# Patient Record
Sex: Female | Born: 1995 | Hispanic: Yes | Marital: Single | Smoking: Never smoker
Health system: Southern US, Community
[De-identification: ages and names within clinical notes are randomized; demographics above are authoritative.]

## PROBLEM LIST (undated history)

## (undated) HISTORY — PX: APPENDECTOMY: SHX54

---

## 2015-06-21 ENCOUNTER — Ambulatory Visit: Payer: Self-pay | Admitting: Physician Assistant

## 2015-06-21 ENCOUNTER — Encounter: Payer: Self-pay | Admitting: Physician Assistant

## 2015-06-21 VITALS — BP 100/70 | HR 100 | Temp 98.7°F

## 2015-06-21 DIAGNOSIS — J020 Streptococcal pharyngitis: Secondary | ICD-10-CM

## 2015-06-21 LAB — POCT RAPID STREP A (OFFICE): Rapid Strep A Screen: POSITIVE — AB

## 2015-06-21 MED ORDER — AZITHROMYCIN 250 MG PO TABS: ORAL_TABLET | ORAL | Status: DC

## 2015-06-21 NOTE — Progress Notes (Signed)
S: c/o sore throat for 1 day, no fever/chills, no cough or congestion, just hurts to swallow, no known exposure to strep  O: vitals wnl, nad, tms clear, throat red with swollen tonsils, neck supple no lymph lungs c t a, cv rrr, q strep with? Very faint +,   A: acute pharyngitis  P: zpack

## 2016-08-12 DIAGNOSIS — N921 Excessive and frequent menstruation with irregular cycle: Secondary | ICD-10-CM | POA: Diagnosis not present

## 2016-08-12 DIAGNOSIS — Z7689 Persons encountering health services in other specified circumstances: Secondary | ICD-10-CM | POA: Diagnosis not present

## 2017-01-27 ENCOUNTER — Ambulatory Visit: Payer: Self-pay | Admitting: Physician Assistant

## 2017-01-27 ENCOUNTER — Encounter: Payer: Self-pay | Admitting: Physician Assistant

## 2017-01-27 VITALS — BP 100/70 | HR 101 | Temp 98.8°F

## 2017-01-27 DIAGNOSIS — N390 Urinary tract infection, site not specified: Secondary | ICD-10-CM

## 2017-01-27 DIAGNOSIS — R319 Hematuria, unspecified: Secondary | ICD-10-CM

## 2017-01-27 DIAGNOSIS — R300 Dysuria: Secondary | ICD-10-CM

## 2017-01-27 LAB — POCT URINALYSIS DIPSTICK
Bilirubin, UA: NEGATIVE
Glucose, UA: NEGATIVE
Ketones, UA: NEGATIVE
Nitrite, UA: NEGATIVE
Spec Grav, UA: 1.025 (ref 1.010–1.025)
Urobilinogen, UA: 0.2 E.U./dL
pH, UA: 6.5 (ref 5.0–8.0)

## 2017-01-27 MED ORDER — NITROFURANTOIN MONOHYD MACRO 100 MG PO CAPS: 100.0000 mg | ORAL_CAPSULE | Freq: Two times a day (BID) | ORAL | 0 refills | Status: AC

## 2017-01-27 NOTE — Progress Notes (Signed)
S:  C/o uti sx for 2 days, burning, urgency, frequency, denies fever/chills, vaginal discharge, abdominal pain or flank pain:  Remainder ros neg  O:  Vitals wnl, nad, no cva tenderness, back nontender, lungs c t a,cv rrr, abd soft nontender, bs normal, n/v intact, ua 2+leuks, 3+ blood  A: uti  P: macrobid  bid x 7d, increase water intake, add cranberry juice, return if not improving in 2 -3 days, return earlier if worsening, discussed pyelonephritis sx

## 2017-05-11 DIAGNOSIS — N912 Amenorrhea, unspecified: Secondary | ICD-10-CM | POA: Diagnosis not present

## 2017-05-26 DIAGNOSIS — O3680X0 Pregnancy with inconclusive fetal viability, not applicable or unspecified: Secondary | ICD-10-CM | POA: Diagnosis not present

## 2017-06-03 DIAGNOSIS — Z1389 Encounter for screening for other disorder: Secondary | ICD-10-CM | POA: Diagnosis not present

## 2017-06-03 DIAGNOSIS — Z3200 Encounter for pregnancy test, result unknown: Secondary | ICD-10-CM | POA: Diagnosis not present

## 2017-06-03 DIAGNOSIS — Z3201 Encounter for pregnancy test, result positive: Secondary | ICD-10-CM | POA: Diagnosis not present

## 2017-06-22 DIAGNOSIS — Z1389 Encounter for screening for other disorder: Secondary | ICD-10-CM | POA: Diagnosis not present

## 2017-06-22 DIAGNOSIS — Z3401 Encounter for supervision of normal first pregnancy, first trimester: Secondary | ICD-10-CM | POA: Diagnosis not present

## 2017-07-21 DIAGNOSIS — Z3402 Encounter for supervision of normal first pregnancy, second trimester: Secondary | ICD-10-CM | POA: Diagnosis not present

## 2017-07-23 ENCOUNTER — Other Ambulatory Visit: Payer: Self-pay | Admitting: Family Medicine

## 2017-07-23 DIAGNOSIS — Z3402 Encounter for supervision of normal first pregnancy, second trimester: Secondary | ICD-10-CM

## 2017-08-17 ENCOUNTER — Ambulatory Visit
Admission: RE | Admit: 2017-08-17 | Discharge: 2017-08-17 | Disposition: A | Discharge status: Home / Self Care | Payer: 59 | Source: Ambulatory Visit | Attending: Family Medicine | Admitting: Family Medicine

## 2017-08-17 DIAGNOSIS — Z369 Encounter for antenatal screening, unspecified: Secondary | ICD-10-CM | POA: Diagnosis not present

## 2017-08-17 DIAGNOSIS — Z3402 Encounter for supervision of normal first pregnancy, second trimester: Secondary | ICD-10-CM | POA: Insufficient documentation

## 2017-08-17 DIAGNOSIS — Z3A19 19 weeks gestation of pregnancy: Secondary | ICD-10-CM | POA: Insufficient documentation

## 2017-08-18 DIAGNOSIS — Z3402 Encounter for supervision of normal first pregnancy, second trimester: Secondary | ICD-10-CM | POA: Diagnosis not present

## 2017-09-20 DIAGNOSIS — Z3402 Encounter for supervision of normal first pregnancy, second trimester: Secondary | ICD-10-CM | POA: Diagnosis not present

## 2017-09-20 DIAGNOSIS — Z1389 Encounter for screening for other disorder: Secondary | ICD-10-CM | POA: Diagnosis not present

## 2017-10-12 ENCOUNTER — Other Ambulatory Visit: Payer: Self-pay | Admitting: Family Medicine

## 2017-10-12 DIAGNOSIS — Z3402 Encounter for supervision of normal first pregnancy, second trimester: Secondary | ICD-10-CM

## 2017-10-12 DIAGNOSIS — O4442 Low lying placenta NOS or without hemorrhage, second trimester: Secondary | ICD-10-CM

## 2017-10-15 DIAGNOSIS — Z112 Encounter for screening for other bacterial diseases: Secondary | ICD-10-CM | POA: Diagnosis not present

## 2017-10-15 DIAGNOSIS — J029 Acute pharyngitis, unspecified: Secondary | ICD-10-CM | POA: Diagnosis not present

## 2017-10-20 ENCOUNTER — Ambulatory Visit: Admission: RE | Admit: 2017-10-20 | Payer: 59 | Source: Ambulatory Visit

## 2017-10-20 DIAGNOSIS — Z3403 Encounter for supervision of normal first pregnancy, third trimester: Secondary | ICD-10-CM | POA: Diagnosis not present

## 2017-10-20 DIAGNOSIS — Z1389 Encounter for screening for other disorder: Secondary | ICD-10-CM | POA: Diagnosis not present

## 2017-10-20 DIAGNOSIS — Z23 Encounter for immunization: Secondary | ICD-10-CM | POA: Diagnosis not present

## 2017-10-27 ENCOUNTER — Ambulatory Visit
Admission: RE | Admit: 2017-10-27 | Discharge: 2017-10-27 | Disposition: A | Discharge status: Home / Self Care | Payer: 59 | Source: Ambulatory Visit | Attending: Family Medicine | Admitting: Family Medicine

## 2017-10-27 DIAGNOSIS — Z3402 Encounter for supervision of normal first pregnancy, second trimester: Secondary | ICD-10-CM

## 2017-10-27 DIAGNOSIS — Z3A29 29 weeks gestation of pregnancy: Secondary | ICD-10-CM | POA: Insufficient documentation

## 2017-10-27 DIAGNOSIS — O4442 Low lying placenta NOS or without hemorrhage, second trimester: Secondary | ICD-10-CM | POA: Insufficient documentation

## 2017-10-27 DIAGNOSIS — O4423 Partial placenta previa NOS or without hemorrhage, third trimester: Secondary | ICD-10-CM | POA: Diagnosis not present

## 2017-11-02 DIAGNOSIS — Z1389 Encounter for screening for other disorder: Secondary | ICD-10-CM | POA: Diagnosis not present

## 2017-11-02 DIAGNOSIS — Z3403 Encounter for supervision of normal first pregnancy, third trimester: Secondary | ICD-10-CM | POA: Diagnosis not present

## 2017-11-17 DIAGNOSIS — Z3403 Encounter for supervision of normal first pregnancy, third trimester: Secondary | ICD-10-CM | POA: Diagnosis not present

## 2017-11-17 DIAGNOSIS — Z1389 Encounter for screening for other disorder: Secondary | ICD-10-CM | POA: Diagnosis not present

## 2017-11-26 DIAGNOSIS — Z3403 Encounter for supervision of normal first pregnancy, third trimester: Secondary | ICD-10-CM | POA: Diagnosis not present

## 2017-12-02 DIAGNOSIS — Z3403 Encounter for supervision of normal first pregnancy, third trimester: Secondary | ICD-10-CM | POA: Diagnosis not present

## 2017-12-02 DIAGNOSIS — Z1389 Encounter for screening for other disorder: Secondary | ICD-10-CM | POA: Diagnosis not present

## 2017-12-09 DIAGNOSIS — Z3403 Encounter for supervision of normal first pregnancy, third trimester: Secondary | ICD-10-CM | POA: Diagnosis not present

## 2017-12-09 DIAGNOSIS — Z1389 Encounter for screening for other disorder: Secondary | ICD-10-CM | POA: Diagnosis not present

## 2017-12-13 DIAGNOSIS — R600 Localized edema: Secondary | ICD-10-CM | POA: Diagnosis not present

## 2017-12-13 DIAGNOSIS — Z3A36 36 weeks gestation of pregnancy: Secondary | ICD-10-CM | POA: Diagnosis not present

## 2017-12-13 DIAGNOSIS — O471 False labor at or after 37 completed weeks of gestation: Secondary | ICD-10-CM | POA: Diagnosis not present

## 2017-12-13 DIAGNOSIS — O42913 Preterm premature rupture of membranes, unspecified as to length of time between rupture and onset of labor, third trimester: Secondary | ICD-10-CM | POA: Diagnosis not present

## 2018-01-06 DIAGNOSIS — Z7189 Other specified counseling: Secondary | ICD-10-CM | POA: Diagnosis not present

## 2018-01-06 DIAGNOSIS — R768 Other specified abnormal immunological findings in serum: Secondary | ICD-10-CM | POA: Diagnosis not present

## 2018-01-06 DIAGNOSIS — Z1389 Encounter for screening for other disorder: Secondary | ICD-10-CM | POA: Diagnosis not present

## 2018-01-06 DIAGNOSIS — Z309 Encounter for contraceptive management, unspecified: Secondary | ICD-10-CM | POA: Diagnosis not present

## 2018-02-08 DIAGNOSIS — Z3200 Encounter for pregnancy test, result unknown: Secondary | ICD-10-CM | POA: Diagnosis not present

## 2018-02-08 DIAGNOSIS — Z1389 Encounter for screening for other disorder: Secondary | ICD-10-CM | POA: Diagnosis not present

## 2018-02-08 DIAGNOSIS — Z3049 Encounter for surveillance of other contraceptives: Secondary | ICD-10-CM | POA: Diagnosis not present

## 2018-02-08 DIAGNOSIS — Z309 Encounter for contraceptive management, unspecified: Secondary | ICD-10-CM | POA: Diagnosis not present

## 2018-02-08 DIAGNOSIS — Z0289 Encounter for other administrative examinations: Secondary | ICD-10-CM | POA: Diagnosis not present

## 2018-02-08 DIAGNOSIS — Z23 Encounter for immunization: Secondary | ICD-10-CM | POA: Diagnosis not present

## 2018-08-16 IMAGING — US US OB FOLLOW-UP
1 of 2 series · 13 of 28 positions shown · non-contrast
Comparison: 08/17/2017

CLINICAL DATA: Low-lying placenta on prior exam.

EXAM:
OBSTETRIC 14+ WK ULTRASOUND FOLLOW-UP

[Series 1: us ob follow-up · 13 of 78 slices shown]
[im 3/78]
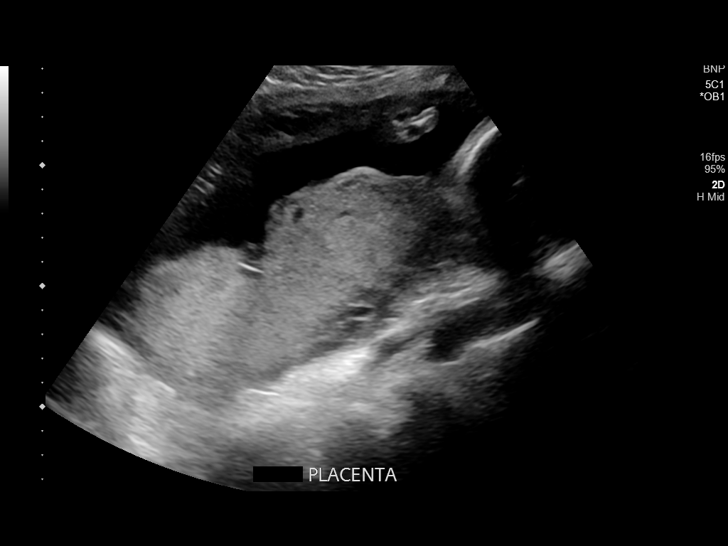
[im 9/78]
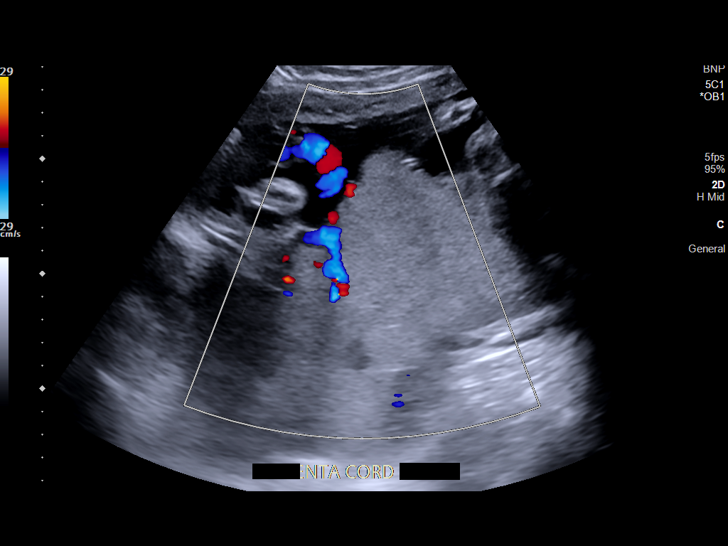
[im 15/78]
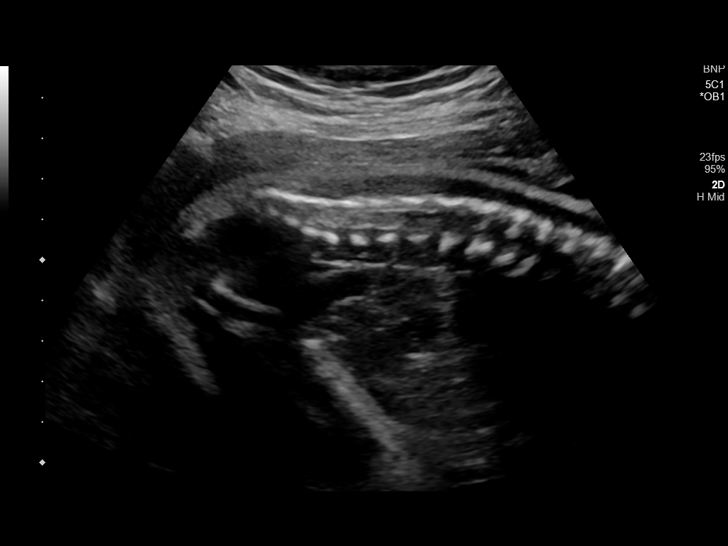
[im 21/78]
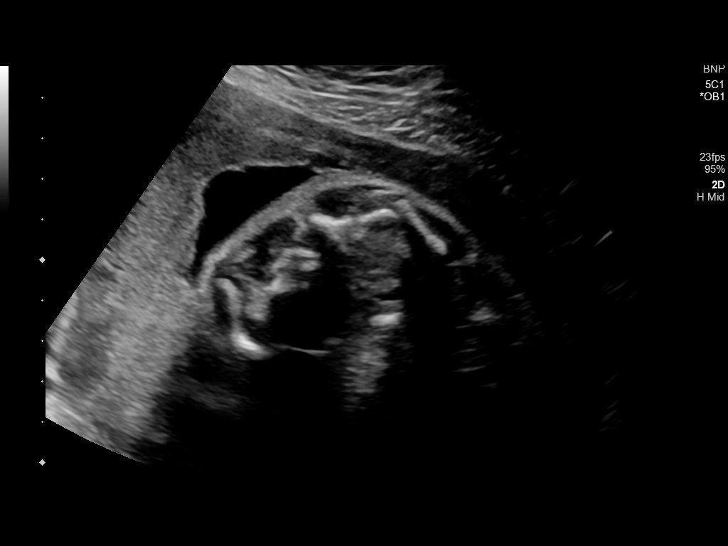
[im 27/78]
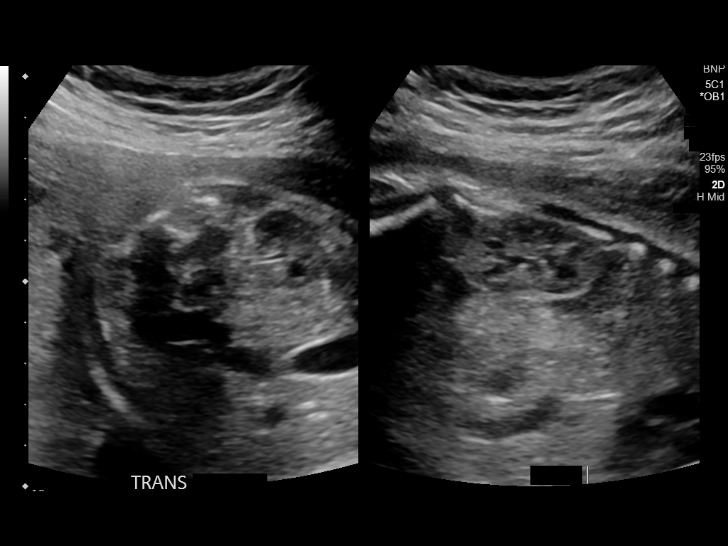
[im 33/78]
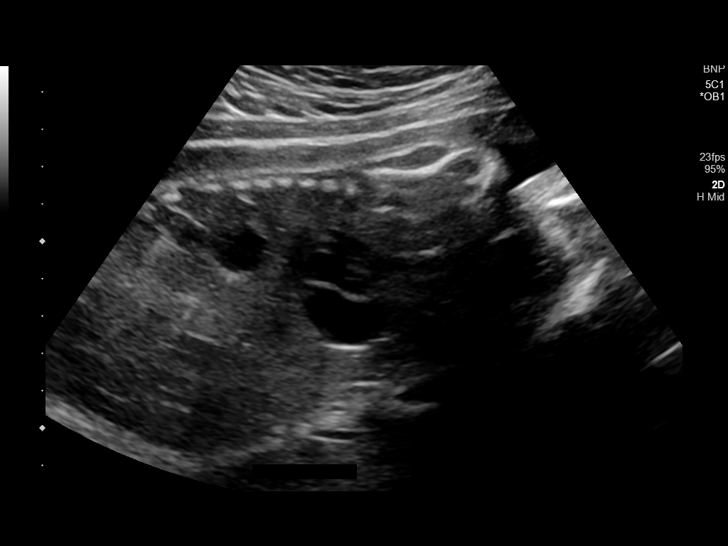
[im 42/78]
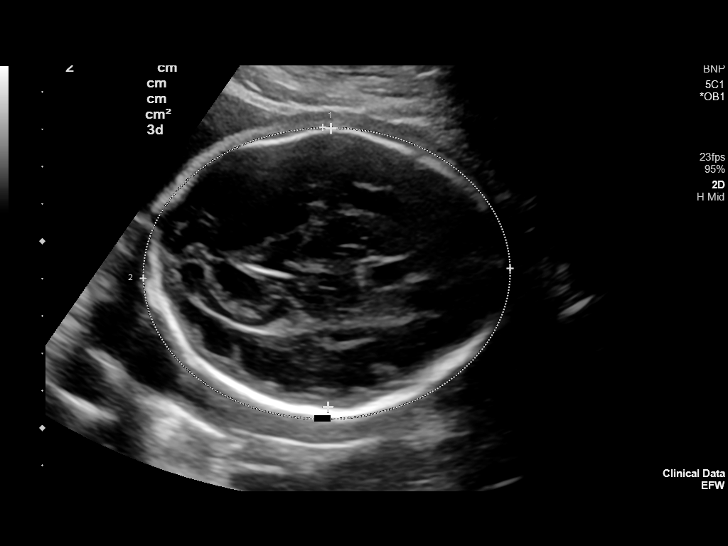
[im 48/78]
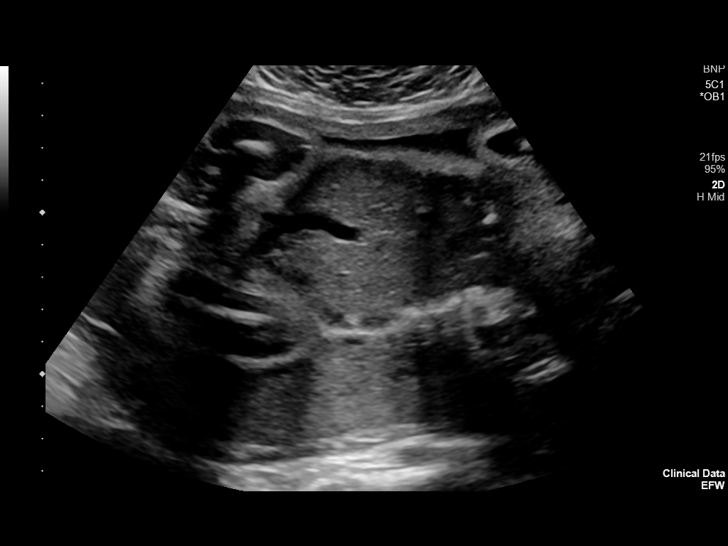
[im 54/78]
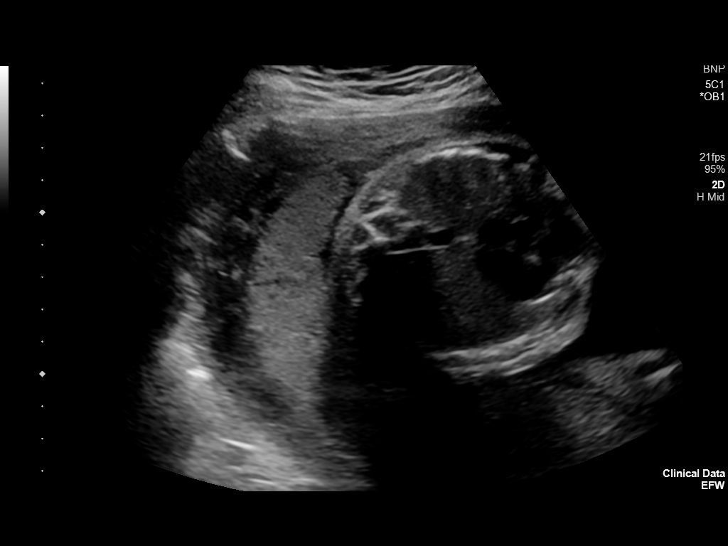
[im 60/78]
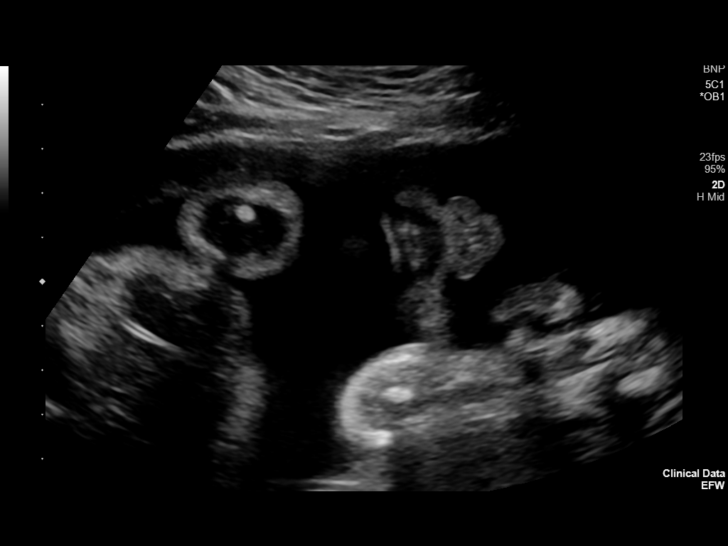
[im 66/78]
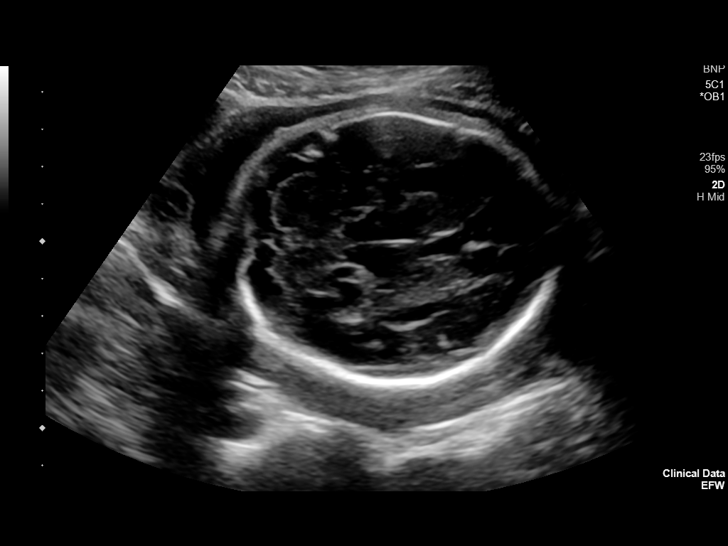
[im 72/78]
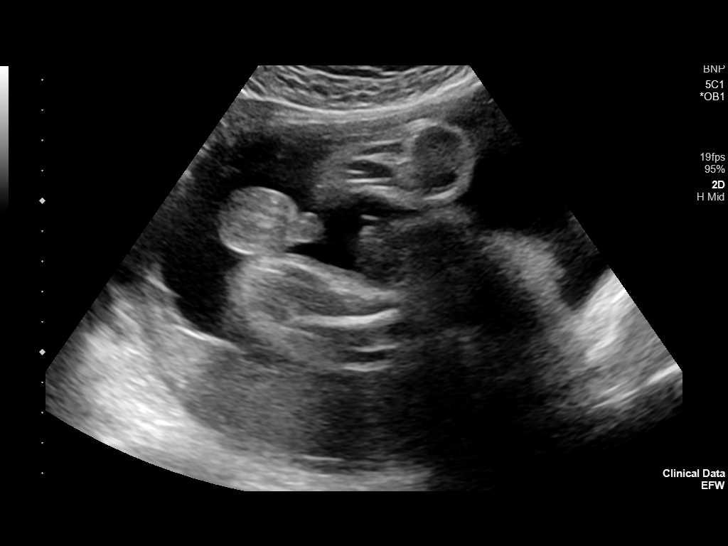
[im 78/78]
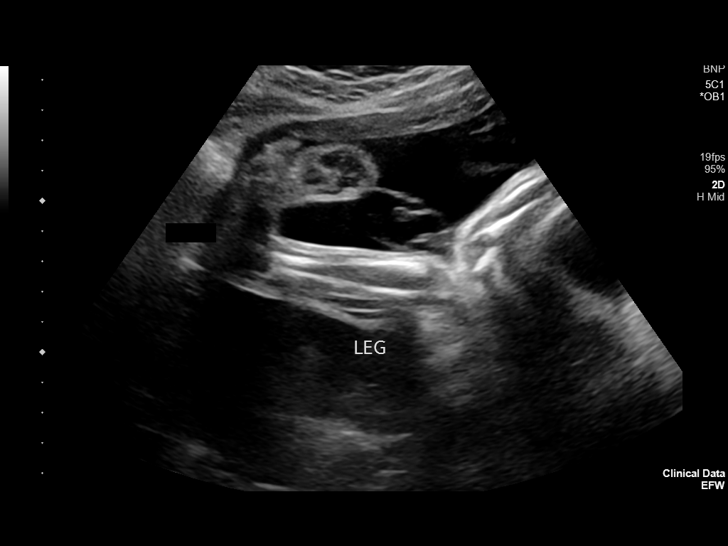

[13 of 28 positions shown; findings below may reference images not displayed]

FINDINGS: Number of Fetuses: 1

Heart Rate:  127 bpm

Movement: Yes

Presentation: Cephalic

Previa: No

Placental Location: Posterior. Margin of the placenta is located
cm from the cervical os.

Amniotic Fluid (Subjective): Normal

Amniotic Fluid (Objective):

AFI 19.7 cm (5%ile= 9.2 cm, 95%= 23.1 cm for 19.7 wks)

FETAL BIOMETRY

BPD:  7.5cm 30w 0d

HC:    27.9cm 30w 3d

AC:   25.3cm 29w 4d

FL:   5.4cm 28w 4d

Current Mean GA: 29w 3d US EDC: 01/09/2018

Assigned GA:  29w 1d Assigned EDC:  01/11/2018

Estimated Fetal Weight:  1372g 28.2%ile

FETAL ANATOMY

Lateral Ventricles: Appears normal

Thalami/CSP: Appears normal

Posterior Fossa:  Appears normal

Nuchal Region: Previously visualized

Upper Lip: Previously seen

Spine: Appears normal

4 Chamber Heart on Left: Appears normal

LVOT: Previously seen

RVOT: Previously seen

Stomach on Left: Appears normal

3 Vessel Cord: Appears normal

Cord Insertion site: Appears normal

Kidneys: Appears normal

Bladder: Appears normal

Extremities: Appears normal

Sex: Male

MATERNAL FINDINGS:

Cervix: Closed with normal cervical length measuring 3.6 cm.
IMPRESSION: Single live intrauterine pregnancy as detailed above.

## 2018-08-31 DIAGNOSIS — Z309 Encounter for contraceptive management, unspecified: Secondary | ICD-10-CM | POA: Diagnosis not present

## 2018-08-31 DIAGNOSIS — N939 Abnormal uterine and vaginal bleeding, unspecified: Secondary | ICD-10-CM | POA: Diagnosis not present

## 2018-11-10 DIAGNOSIS — Z3189 Encounter for other procreative management: Secondary | ICD-10-CM | POA: Diagnosis not present

## 2018-11-10 DIAGNOSIS — Z8751 Personal history of pre-term labor: Secondary | ICD-10-CM | POA: Diagnosis not present

## 2018-11-10 DIAGNOSIS — Z1389 Encounter for screening for other disorder: Secondary | ICD-10-CM | POA: Diagnosis not present

## 2018-11-10 DIAGNOSIS — Z113 Encounter for screening for infections with a predominantly sexual mode of transmission: Secondary | ICD-10-CM | POA: Diagnosis not present

## 2018-11-10 DIAGNOSIS — Z3049 Encounter for surveillance of other contraceptives: Secondary | ICD-10-CM | POA: Diagnosis not present

## 2019-03-07 ENCOUNTER — Emergency Department
Admission: EM | Admit: 2019-03-07 | Discharge: 2019-03-07 | Disposition: A | Discharge status: Home / Self Care | Payer: 59 | Attending: Emergency Medicine | Admitting: Emergency Medicine

## 2019-03-07 ENCOUNTER — Emergency Department: Payer: 59

## 2019-03-07 ENCOUNTER — Other Ambulatory Visit: Payer: Self-pay

## 2019-03-07 DIAGNOSIS — S0990XA Unspecified injury of head, initial encounter: Secondary | ICD-10-CM | POA: Diagnosis not present

## 2019-03-07 DIAGNOSIS — Y92410 Unspecified street and highway as the place of occurrence of the external cause: Secondary | ICD-10-CM | POA: Insufficient documentation

## 2019-03-07 DIAGNOSIS — Y999 Unspecified external cause status: Secondary | ICD-10-CM | POA: Insufficient documentation

## 2019-03-07 DIAGNOSIS — R519 Headache, unspecified: Secondary | ICD-10-CM | POA: Diagnosis not present

## 2019-03-07 DIAGNOSIS — Y9389 Activity, other specified: Secondary | ICD-10-CM | POA: Insufficient documentation

## 2019-03-07 LAB — POCT PREGNANCY, URINE: Preg Test, Ur: NEGATIVE
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
## 2019-03-07 NOTE — Discharge Instructions (Signed)
Advised extra strength Tylenol for headache. °
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
## 2019-04-03 DIAGNOSIS — R3915 Urgency of urination: Secondary | ICD-10-CM | POA: Diagnosis not present

## 2019-04-03 DIAGNOSIS — Z1389 Encounter for screening for other disorder: Secondary | ICD-10-CM | POA: Diagnosis not present

## 2019-04-03 DIAGNOSIS — Z3200 Encounter for pregnancy test, result unknown: Secondary | ICD-10-CM | POA: Diagnosis not present

## 2019-04-03 DIAGNOSIS — L298 Other pruritus: Secondary | ICD-10-CM | POA: Diagnosis not present

## 2019-04-20 DIAGNOSIS — L509 Urticaria, unspecified: Secondary | ICD-10-CM | POA: Diagnosis not present

## 2019-05-19 DIAGNOSIS — Z113 Encounter for screening for infections with a predominantly sexual mode of transmission: Secondary | ICD-10-CM | POA: Diagnosis not present

## 2019-05-19 DIAGNOSIS — Z1389 Encounter for screening for other disorder: Secondary | ICD-10-CM | POA: Diagnosis not present

## 2019-06-06 DIAGNOSIS — Z3200 Encounter for pregnancy test, result unknown: Secondary | ICD-10-CM | POA: Diagnosis not present

## 2019-06-06 DIAGNOSIS — Z309 Encounter for contraceptive management, unspecified: Secondary | ICD-10-CM | POA: Diagnosis not present

## 2019-08-04 ENCOUNTER — Ambulatory Visit: Payer: Medicaid Other | Attending: Internal Medicine

## 2019-08-04 DIAGNOSIS — Z23 Encounter for immunization: Secondary | ICD-10-CM

## 2019-08-04 NOTE — Progress Notes (Signed)
   Covid-19 Vaccination Clinic  Name:  Soleil Mas    MRN: [id number] DOB: Sep 06, 1995  08/04/2019  Ms. Margeret Stachnik was observed post Covid-19 immunization for 15 minutes without incident. She was provided with Vaccine Information Sheet and instruction to access the V-Safe system.   Ms. Kyndel Egger was instructed to call 911 with any severe reactions post vaccine: Marland Kitchen Difficulty breathing  . Swelling of face and throat  . A fast heartbeat  . A bad rash all over body  . Dizziness and weakness   Immunizations Administered    Name Date Dose VIS Date Route   Pfizer COVID-19 Vaccine 08/04/2019 12:10 PM 0.3 mL 04/14/2019 Intramuscular   Manufacturer: [employer], [employer]   Lot: [id number]   NDC: [address]-4

## 2019-08-25 ENCOUNTER — Ambulatory Visit: Payer: Self-pay

## 2019-10-06 DIAGNOSIS — R3915 Urgency of urination: Secondary | ICD-10-CM | POA: Diagnosis not present

## 2019-10-17 DIAGNOSIS — Z3200 Encounter for pregnancy test, result unknown: Secondary | ICD-10-CM | POA: Diagnosis not present

## 2019-10-17 DIAGNOSIS — Z3201 Encounter for pregnancy test, result positive: Secondary | ICD-10-CM | POA: Diagnosis not present
